# Patient Record
Sex: Female | Born: 1995 | Race: Black or African American | Hispanic: No | Marital: Single | Smoking: Never smoker
Health system: Southern US, Community
[De-identification: ages and names within clinical notes are randomized; demographics above are authoritative.]

---
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
## 2017-11-25 ENCOUNTER — Encounter: Payer: Self-pay | Admitting: Intensive Care

## 2017-11-25 ENCOUNTER — Emergency Department: Payer: Self-pay

## 2017-11-25 ENCOUNTER — Emergency Department
Admission: EM | Admit: 2017-11-25 | Discharge: 2017-11-25 | Disposition: A | Discharge status: Home / Self Care | Payer: Self-pay | Attending: Student in an Organized Health Care Education/Training Program | Admitting: Student in an Organized Health Care Education/Training Program

## 2017-11-25 DIAGNOSIS — R1084 Generalized abdominal pain: Secondary | ICD-10-CM | POA: Insufficient documentation

## 2017-11-25 LAB — CBC
HCT: 37.9 % (ref 35.0–47.0)
Hemoglobin: 13.1 g/dL (ref 12.0–16.0)
MCH: 31.7 pg (ref 26.0–34.0)
MCHC: 34.5 g/dL (ref 32.0–36.0)
MCV: 91.9 fL (ref 80.0–100.0)
PLATELETS: 269 10*3/uL (ref 150–440)
RBC: 4.12 MIL/uL (ref 3.80–5.20)
RDW: 12.6 % (ref 11.5–14.5)
WBC: 8.8 10*3/uL (ref 3.6–11.0)

## 2017-11-25 LAB — URINALYSIS, COMPLETE (UACMP) WITH MICROSCOPIC
Bacteria, UA: NONE SEEN
Bilirubin Urine: NEGATIVE
Glucose, UA: NEGATIVE mg/dL
Hgb urine dipstick: NEGATIVE
KETONES UR: NEGATIVE mg/dL
Leukocytes, UA: NEGATIVE
Nitrite: NEGATIVE
PH: 6 (ref 5.0–8.0)
Protein, ur: NEGATIVE mg/dL
Specific Gravity, Urine: 1.029 (ref 1.005–1.030)

## 2017-11-25 LAB — COMPREHENSIVE METABOLIC PANEL
ALT: 10 U/L — AB (ref 14–54)
AST: 15 U/L (ref 15–41)
Albumin: 4 g/dL (ref 3.5–5.0)
Alkaline Phosphatase: 68 U/L (ref 38–126)
Anion gap: 7 (ref 5–15)
BUN: 7 mg/dL (ref 6–20)
CHLORIDE: 104 mmol/L (ref 101–111)
CO2: 25 mmol/L (ref 22–32)
CREATININE: 0.57 mg/dL (ref 0.44–1.00)
Calcium: 8.8 mg/dL — ABNORMAL LOW (ref 8.9–10.3)
GFR calc Af Amer: >60 mL/min (ref >60)
GFR calc non Af Amer: >60 mL/min (ref >60)
GLUCOSE: 90 mg/dL (ref 65–99)
Potassium: 3.5 mmol/L (ref 3.5–5.1)
SODIUM: 136 mmol/L (ref 135–145)
Total Bilirubin: 0.5 mg/dL (ref 0.3–1.2)
Total Protein: 8.2 g/dL — ABNORMAL HIGH (ref 6.5–8.1)

## 2017-11-25 LAB — HCG, QUANTITATIVE, PREGNANCY: hCG, Beta Chain, Quant, S: <1 m[IU]/mL (ref <5)

## 2017-11-25 LAB — POCT PREGNANCY, URINE: PREG TEST UR: NEGATIVE

## 2017-11-25 LAB — LIPASE, BLOOD: LIPASE: 23 U/L (ref 11–51)

## 2017-11-25 MED ORDER — HYDROCODONE-ACETAMINOPHEN 5-325 MG PO TABS
1.0000 | ORAL_TABLET | Freq: Once | ORAL | Status: AC
  Administered 2017-11-25: 1 via ORAL
  Filled 2017-11-25: qty 1

## 2017-11-25 MED ORDER — SIMETHICONE 80 MG PO TABS: 1.0000 | ORAL_TABLET | Freq: Two times a day (BID) | ORAL | 0 refills | Status: AC | PRN

## 2017-11-25 MED ORDER — DICYCLOMINE HCL 10 MG PO CAPS: 10.0000 mg | ORAL_CAPSULE | Freq: Three times a day (TID) | ORAL | 0 refills | Status: AC | PRN

## 2017-11-25 NOTE — Discharge Instructions (Signed)

## 2017-11-25 NOTE — ED Notes (Signed)
Pt ambulatory to POV without difficulty. VSS. NAD. Family with pt. Discharge instructions, RX and follow up discussed. Questions and concerns addressed.

## 2017-11-25 NOTE — ED Triage Notes (Addendum)
Patient c/o R flank pain X3 days. Patient states her last normal cycle was a week ago and is now experiencing bright red spotting. Denies burning upon urination or abnormal d/c. Patient appears to be uncomfortable ambulating and while in triage

## 2017-11-25 NOTE — ED Provider Notes (Signed)
[facility] Emergency Department Provider Note    First MD Initiated Contact with Patient 11/25/17 1740     (approximate)  I have reviewed the triage vital signs and the nursing notes.   HISTORY  Chief Complaint Abdominal Pain    HPI Nichole English is a 22 y.o. female presents today with chronically intermittent right sided abdominal pain on and off for the past 2 3 days associated with some vaginal spotting.  States that her last menstrual cycle was 1 week ago.  Denies any vaginal discharge just the spotting.  Has never had pain like this before.  Does have history of issues with constipation and has been making herself go but states that it does hurt to move her bowels.  Denies any nausea or vomiting.  No fevers.  No flank pain.  Denies any history of abdominal surgeries.    History reviewed. No pertinent past medical history. History reviewed. No pertinent family history. History reviewed. No pertinent surgical history. There are no active problems to display for this patient.     Prior to Admission medications   Medication Sig Start Date End Date Taking? Authorizing Provider  loratadine (CLARITIN) 10 MG tablet Take 1 tablet (10 mg total) by mouth daily. 10/24/14   Presson, Mathis Fare, PA    Allergies Patient has no known allergies.    Social History Social History   Tobacco Use  . Smoking status: Never Smoker  . Smokeless tobacco: Never Used  Substance Use Topics  . Alcohol use: No  . Drug use: Yes    Types: Marijuana    Review of Systems Patient denies headaches, rhinorrhea, blurry vision, numbness, shortness of breath, chest pain, edema, cough, abdominal pain, nausea, vomiting, diarrhea, dysuria, fevers, rashes or hallucinations unless otherwise stated above in HPI. ____________________________________________   PHYSICAL EXAM:  VITAL SIGNS: Vitals:   11/25/17 1548 11/25/17 1803  BP: 118/76 127/83  Pulse: 94   Resp: 14  18  Temp: 98.4 F (36.9 C)   SpO2: 100% 100%    Constitutional: Alert and oriented.  Eyes: Conjunctivae are normal.  Head: Atraumatic. Nose: No congestion/rhinnorhea. Mouth/Throat: Mucous membranes are moist.   Neck: No stridor. Painless ROM.  Cardiovascular: Normal rate, regular rhythm. Grossly normal heart sounds.  Good peripheral circulation. Respiratory: Normal respiratory effort.  No retractions. Lungs CTAB. Gastrointestinal: Soft with mild ttp of right abdomen. No distention. No abdominal bruits. No CVA tenderness. Genitourinary: deferred Musculoskeletal: No lower extremity tenderness nor edema.  No joint effusions. Neurologic:  Normal speech and language. No gross focal neurologic deficits are appreciated. No facial droop Skin:  Skin is warm, dry and intact. No rash noted. Psychiatric: Mood and affect are normal. Speech and behavior are normal.  ____________________________________________   LABS (all labs ordered are listed, but only abnormal results are displayed)  Results for orders placed or performed during the hospital encounter of 11/25/17 (from the past 24 hour(s))  Lipase, blood     Status: None   Collection Time: 11/25/17  3:54 PM  Result Value Ref Range   Lipase 23 11 - 51 U/L  Comprehensive metabolic panel     Status: Abnormal   Collection Time: 11/25/17  3:54 PM  Result Value Ref Range   Sodium 136 135 - 145 mmol/L   Potassium 3.5 3.5 - 5.1 mmol/L   Chloride 104 101 - 111 mmol/L   CO2 25 22 - 32 mmol/L   Glucose, Bld 90 65 - 99 mg/dL  BUN 7 6 - 20 mg/dL   Creatinine, Ser 1.61 0.44 - 1.00 mg/dL   Calcium 8.8 (L) 8.9 - 10.3 mg/dL   Total Protein 8.2 (H) 6.5 - 8.1 g/dL   Albumin 4.0 3.5 - 5.0 g/dL   AST 15 15 - 41 U/L   ALT 10 (L) 14 - 54 U/L   Alkaline Phosphatase 68 38 - 126 U/L   Total Bilirubin 0.5 0.3 - 1.2 mg/dL   GFR calc non Af Amer >60 >60 mL/min   GFR calc Af Amer >60 >60 mL/min   Anion gap 7 5 - 15  CBC     Status: None   Collection  Time: 11/25/17  3:54 PM  Result Value Ref Range   WBC 8.8 3.6 - 11.0 K/uL   RBC 4.12 3.80 - 5.20 MIL/uL   Hemoglobin 13.1 12.0 - 16.0 g/dL   HCT 09.6 04.5 - 40.9 %   MCV 91.9 80.0 - 100.0 fL   MCH 31.7 26.0 - 34.0 pg   MCHC 34.5 32.0 - 36.0 g/dL   RDW 81.1 91.4 - 78.2 %   Platelets 269 150 - 440 K/uL  Urinalysis, Complete w Microscopic     Status: Abnormal   Collection Time: 11/25/17  3:54 PM  Result Value Ref Range   Color, Urine YELLOW (A) YELLOW   APPearance CLEAR (A) CLEAR   Specific Gravity, Urine 1.029 1.005 - 1.030   pH 6.0 5.0 - 8.0   Glucose, UA NEGATIVE NEGATIVE mg/dL   Hgb urine dipstick NEGATIVE NEGATIVE   Bilirubin Urine NEGATIVE NEGATIVE   Ketones, ur NEGATIVE NEGATIVE mg/dL   Protein, ur NEGATIVE NEGATIVE mg/dL   Nitrite NEGATIVE NEGATIVE   Leukocytes, UA NEGATIVE NEGATIVE   RBC / HPF 0-5 0 - 5 RBC/hpf   WBC, UA 0-5 0 - 5 WBC/hpf   Bacteria, UA NONE SEEN NONE SEEN   Squamous Epithelial / LPF 0-5 0 - 5   Mucus PRESENT   hCG, quantitative, pregnancy     Status: None   Collection Time: 11/25/17  3:54 PM  Result Value Ref Range   hCG, Beta Chain, Quant, S <1 <5 mIU/mL  Pregnancy, urine POC     Status: None   Collection Time: 11/25/17  4:27 PM  Result Value Ref Range   Preg Test, Ur NEGATIVE NEGATIVE   ____________________________________________ ____________________________________________  RADIOLOGY  I personally reviewed all radiographic images ordered to evaluate for the above acute complaints and reviewed radiology reports and findings.  These findings were personally discussed with the patient.  Please see medical record for radiology report.  ____________________________________________   PROCEDURES  Procedure(s) performed:  Procedures    Critical Care performed: no ____________________________________________   INITIAL IMPRESSION / ASSESSMENT AND PLAN / ED COURSE  Pertinent labs & imaging results that were available during my care of the  patient were reviewed by me and considered in my medical decision making (see chart for details).   DDX: cyst, ectopic, msk strain, constipation, enteritis, cholelithiasis, cholecystitis, uti, stone  Nichole English is a 22 y.o. who presents to the ED with was as described above.  Patient afebrile Heema dynamically stable.  Does have some mild tenderness on the right side of her abdomen but is also tympanitic to percussion.  No peritonitis or rebound tenderness.  Differential at this point is very broad.  Given the absence of fever or leukocytosis does not seem clinically consistent with Coley cystitis or acute appendicitis.  Patient denies any vaginal discharge.  Given time in her menstrual cycle so ultrasound was ordered to exclude torsion or cyst.  No evidence of PID.  Urinalysis shows no evidence of leukocytosis or hematuria.  KUB does show some mild gaseous distention without evidence of obstructive pattern.  Patient was able to tolerate PO and was able to ambulate with a steady gait.       As part of my medical decision making, I reviewed the following data within the electronic MEDICAL RECORD NUMBER Nursing notes reviewed and incorporated, Labs reviewed, notes from prior ED visits  ____________________________________________   FINAL CLINICAL IMPRESSION(S) / ED DIAGNOSES  Final diagnoses:  Generalized abdominal pain      NEW MEDICATIONS STARTED DURING THIS VISIT:  New Prescriptions   No medications on file     Note:  This document was prepared using Dragon voice recognition software and may include unintentional dictation errors.    Willy Eddyobinson, Eeshan Verbrugge, MD 11/25/17 2016

## 2017-11-25 NOTE — ED Notes (Signed)
Patient transported to [facility] 

## 2017-12-06 ENCOUNTER — Encounter: Payer: Self-pay | Admitting: Obstetrics and Gynecology

## 2017-12-06 ENCOUNTER — Ambulatory Visit (INDEPENDENT_AMBULATORY_CARE_PROVIDER_SITE_OTHER): Payer: Self-pay | Admitting: Obstetrics and Gynecology

## 2017-12-06 VITALS — BP 116/80 | HR 100

## 2017-12-06 DIAGNOSIS — R1010 Upper abdominal pain, unspecified: Secondary | ICD-10-CM

## 2017-12-06 DIAGNOSIS — K5900 Constipation, unspecified: Secondary | ICD-10-CM

## 2017-12-06 DIAGNOSIS — N3001 Acute cystitis with hematuria: Secondary | ICD-10-CM

## 2017-12-06 LAB — POCT URINALYSIS DIP (DEVICE)
Bilirubin Urine: NEGATIVE
Glucose, UA: NEGATIVE mg/dL
KETONES UR: 40 mg/dL — AB
Nitrite: NEGATIVE
PROTEIN: 100 mg/dL — AB
SPECIFIC GRAVITY, URINE: 1.025 (ref 1.005–1.030)
UROBILINOGEN UA: 0.2 mg/dL (ref 0.0–1.0)
pH: 7 (ref 5.0–8.0)

## 2017-12-06 MED ORDER — CEPHALEXIN 500 MG PO CAPS: 500.0000 mg | ORAL_CAPSULE | Freq: Two times a day (BID) | ORAL | 0 refills | Status: AC

## 2017-12-06 NOTE — Progress Notes (Signed)
Having pain for several wks. Was seen at [facility] [address] and all test ok Told probably gas but unable to pick up scripts due to no ins. Having some dysurai and thinks also burning around clitoris. Sometimes sees some spotting when wipes. Using has BM in morning but that has been irreg since having stomach pains. Having a lot of nausea but only vomited once but does have acid reflex. Saw white d/c yesterday. No itching - only burning.

## 2017-12-06 NOTE — Patient Instructions (Signed)
For your constipation, we'll need to try several ways to treat this:  Try first:  Drink plenty of fluid, preferably water, throughout the day  Eat foods high in fiber such as fruits, vegetables, and grains  Exercise, such as walking, is a good way to keep your bowels regular  Drink warm fluids, especially warm prune juice, or decaf coffee  Eat a 1/2 cup of real oatmeal (not instant), 1/2 cup applesauce, and 1/2-1 cup warm prune juice every day  If not helping then try:  If needed, you may take Colace (docusate sodium) stool softener once or twice a day to help keep the stool soft.   If you still are having problems with constipation, you may take Miralax once daily as needed to help keep your bowels regular.  Stop taking it if you have diarrhea.  You can increase this to 3 times a day to help you go as well.  If you still haven't gone to the restroom after three days, it's time to try either a suppository or an enema.  This should make you go.  Continue to take the stool softener and Miralax, even if you use the suppository.  Your goal is to have a soft bowel movement once every other day at the least. Once you start going to the bathroom, cut back until you achieve that goal.       Urinary Tract Infection, Adult A urinary tract infection (UTI) is an infection of any part of the urinary tract. The urinary tract includes the:  Kidneys.  Ureters.  Bladder.  Urethra.  These organs make, store, and get rid of pee (urine) in the body. Follow these instructions at home:  Take over-the-counter and prescription medicines only as told by your doctor.  If you were prescribed an antibiotic medicine, take it as told by your doctor. Do not stop taking the antibiotic even if you start to feel better.  Avoid the following drinks: ? Alcohol. ? Caffeine. ? Tea. ? Carbonated drinks.  Drink enough fluid to keep your pee clear or pale yellow.  Keep all follow-up visits as told by  your doctor. This is important.  Make sure to: ? Empty your bladder often and completely. Do not to hold pee for long periods of time. ? Empty your bladder before and after sex. ? Wipe from front to back after a bowel movement if you are female. Use each tissue one time when you wipe. Contact a doctor if:  You have back pain.  You have a fever.  You feel sick to your stomach (nauseous).  You throw up (vomit).  Your symptoms do not get better after 3 days.  Your symptoms go away and then come back. Get help right away if:  You have very bad back pain.  You have very bad lower belly (abdominal) pain.  You are throwing up and cannot keep down any medicines or water. This information is not intended to replace advice given to you by your health care provider. Make sure you discuss any questions you have with your health care provider. Document Released: 11/11/2007 Document Revised: 10/31/2015 Document Reviewed: 04/15/2015 Elsevier Interactive Patient Education  [employer]2018 [employer]vier Inc[employer]

## 2017-12-06 NOTE — Progress Notes (Signed)
GYNECOLOGY OFFICE VISIT NOTE  History:  22 y.o. No obstetric history on file. here today for abdominal pain. Patient states she has been having abdominal pain for the last 3 weeks.  Pain moves around.  States that it is mainly in her upper abdomen and sometimes goes to her back.  Feels like spasming of her intestines.  She has been to the ED for this and was told it was gas.  Patient also endorses being constipated.  States that had a bowel movement yesterday that was hard with small amount.  Patient states that she tried a laxative but all it did was make cramping worse.  Patient also believes that she may have a UTI.  Endorsing some dysuria and blood in her urine.  She denies any abnormal vaginal discharge, bleeding, pelvic pain or other concerns.   No past medical history on file.  No past surgical history on file.  The following portions of the patient's history were reviewed and updated as appropriate: allergies, current medications, past family history, past medical history, past social history, past surgical history and problem list.    Review of Systems:  Pertinent items noted in HPI    Objective:  Physical Exam BP 116/80   Pulse 100   LMP 11/15/2017  CONSTITUTIONAL: Well-developed, well-nourished female in no acute distress.  SKIN: Skin is warm and dry. No rash noted. Not diaphoretic. PSYCHIATRIC: Normal mood and affect. CARDIOVASCULAR: Normal heart rate noted RESPIRATORY: Effort and breath sounds normal, no problems with respiration noted ABDOMEN: Soft, no distention noted.  Stool appreciated on palpation in sigmoid colon. Supra[pubic tenderness.  PELVIC: Deferred MUSCULOSKELETAL: Normal range of motion. No edema noted.  Labs and Imaging Dg Abdomen 1 View  Result Date: 11/25/2017 CLINICAL DATA:  Flank pain for 3 days. EXAM: ABDOMEN - 1 VIEW COMPARISON:  None. FINDINGS: The bowel gas pattern is normal. No radio-opaque calculi or other significant radiographic abnormality  are seen. IMPRESSION: Negative. Electronically Signed   By: Sherian Rein M.D.   On: 11/25/2017 18:33   US Abdominal Pelvic Art/vent Flow Doppler  Result Date: 11/25/2017 CLINICAL DATA:  Initial evaluation for acute right lower quadrant pelvic pain. EXAM: TRANSABDOMINAL AND TRANSVAGINAL ULTRASOUND OF PELVIS DOPPLER ULTRASOUND OF OVARIES TECHNIQUE: Both transabdominal and transvaginal ultrasound examinations of the pelvis were performed. Transabdominal technique was performed for global imaging of the pelvis including uterus, ovaries, adnexal regions, and pelvic cul-de-sac. It was necessary to proceed with endovaginal exam following the transabdominal exam to visualize the pelvic structures. Color and duplex Doppler ultrasound was utilized to evaluate blood flow to the ovaries. COMPARISON:  None. FINDINGS: Uterus Measurements: 7.4 x 3.9 x 5.4 cm. No fibroids or other mass visualized. Endometrium Thickness: 6.1 mm.  No focal abnormality visualized. Right ovary Measurements: 3.3 x 2.1 x 2.8 cm. Normal appearance/no adnexal mass. Left ovary Measurements: 3.4 x 2.2 x 2.5 cm. Normal appearance/no adnexal mass. Pulsed Doppler evaluation of both ovaries demonstrates normal low-resistance arterial and venous waveforms. Other findings No abnormal free fluid. IMPRESSION: Normal pelvic ultrasound. No evidence for torsion or other acute abnormality. Electronically Signed   By: Rise Mu M.D.   On: 11/25/2017 19:42   US Pelvic Complete With Transvaginal  Result Date: 11/25/2017 CLINICAL DATA:  Initial evaluation for acute right lower quadrant pelvic pain. EXAM: TRANSABDOMINAL AND TRANSVAGINAL ULTRASOUND OF PELVIS DOPPLER ULTRASOUND OF OVARIES TECHNIQUE: Both transabdominal and transvaginal ultrasound examinations of the pelvis were performed. Transabdominal technique was performed for global imaging of the pelvis including  uterus, ovaries, adnexal regions, and pelvic cul-de-sac. It was necessary to proceed with  endovaginal exam following the transabdominal exam to visualize the pelvic structures. Color and duplex Doppler ultrasound was utilized to evaluate blood flow to the ovaries. COMPARISON:  None. FINDINGS: Uterus Measurements: 7.4 x 3.9 x 5.4 cm. No fibroids or other mass visualized. Endometrium Thickness: 6.1 mm.  No focal abnormality visualized. Right ovary Measurements: 3.3 x 2.1 x 2.8 cm. Normal appearance/no adnexal mass. Left ovary Measurements: 3.4 x 2.2 x 2.5 cm. Normal appearance/no adnexal mass. Pulsed Doppler evaluation of both ovaries demonstrates normal low-resistance arterial and venous waveforms. Other findings No abnormal free fluid. IMPRESSION: Normal pelvic ultrasound. No evidence for torsion or other acute abnormality. Electronically Signed   By: Rise MuBenjamin  McClintock M.D.   On: 11/25/2017 19:42   Results for orders placed or performed in visit on 12/06/17  POCT urinalysis dip (device)  Result Value Ref Range   Glucose, UA NEGATIVE NEGATIVE mg/dL   Bilirubin Urine NEGATIVE NEGATIVE   Ketones, ur 40 (A) NEGATIVE mg/dL   Specific Gravity, Urine 1.025 1.005 - 1.030   Hgb urine dipstick MODERATE (A) NEGATIVE   pH 7.0 5.0 - 8.0   Protein, ur 100 (A) NEGATIVE mg/dL   Urobilinogen, UA 0.2 0.0 - 1.0 mg/dL   Nitrite NEGATIVE NEGATIVE   Leukocytes, UA SMALL (A) NEGATIVE     Assessment & Plan:  1. Pain of upper abdomen VSS. No signs of acute abdomen. Has been followed for this before. Recent imaging with no signs of pelvic abnormalities. Normal DG abdomen as well. Symptoms and pain do seem most consistent with gas and constipation. Discussed bowel regimen and diet changes to help with this.  2. Constipation, unspecified constipation type  3. Acute cystitis with hematuria UA and symptoms cosistent with a UTI. Rx for Keflex. Urine also sent for culture. - Urine Culture  Routine preventative health maintenance measures emphasized. Please refer to After Visit Summary for other  counseling recommendations.   No follow-ups on file.   Caryl AdaJazma , DO [facility]

## 2017-12-07 ENCOUNTER — Encounter: Payer: Self-pay | Admitting: Obstetrics and Gynecology

## 2017-12-08 LAB — URINE CULTURE

## 2021-12-02 ENCOUNTER — Emergency Department
Admission: EM | Admit: 2021-12-02 | Discharge: 2021-12-02 | Disposition: A | Discharge status: Home / Self Care | Payer: Commercial Managed Care - PPO | Attending: Emergency Medicine | Admitting: Emergency Medicine

## 2021-12-02 ENCOUNTER — Emergency Department: Payer: Commercial Managed Care - PPO

## 2021-12-02 ENCOUNTER — Other Ambulatory Visit: Payer: Self-pay

## 2021-12-02 DIAGNOSIS — Y92410 Unspecified street and highway as the place of occurrence of the external cause: Secondary | ICD-10-CM | POA: Insufficient documentation

## 2021-12-02 DIAGNOSIS — S199XXA Unspecified injury of neck, initial encounter: Secondary | ICD-10-CM | POA: Diagnosis present

## 2021-12-02 DIAGNOSIS — S161XXA Strain of muscle, fascia and tendon at neck level, initial encounter: Secondary | ICD-10-CM | POA: Insufficient documentation

## 2021-12-02 MED ORDER — MELOXICAM 15 MG PO TABS: 15.0000 mg | ORAL_TABLET | Freq: Every day | ORAL | 0 refills | Status: AC

## 2021-12-02 MED ORDER — METHOCARBAMOL 500 MG PO TABS: 500.0000 mg | ORAL_TABLET | Freq: Four times a day (QID) | ORAL | 0 refills | Status: AC
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
